# Patient Record
Sex: Female | Born: 1975 | Race: Black or African American | Hispanic: No | Marital: Married | Smoking: Never smoker
Health system: Southern US, Community
[De-identification: ages and names within clinical notes are randomized; demographics above are authoritative.]

---

## 2004-03-05 ENCOUNTER — Emergency Department (HOSPITAL_COMMUNITY): Admission: EM | Admit: 2004-03-05 | Discharge: 2004-03-06 | Payer: Self-pay | Admitting: Emergency Medicine

## 2004-04-07 ENCOUNTER — Emergency Department (HOSPITAL_COMMUNITY): Admission: EM | Admit: 2004-04-07 | Discharge: 2004-04-07 | Payer: Self-pay | Admitting: Emergency Medicine

## 2004-04-29 ENCOUNTER — Emergency Department (HOSPITAL_COMMUNITY): Admission: EM | Admit: 2004-04-29 | Discharge: 2004-04-30 | Payer: Self-pay | Admitting: Emergency Medicine

## 2004-06-15 ENCOUNTER — Emergency Department (HOSPITAL_COMMUNITY): Admission: EM | Admit: 2004-06-15 | Discharge: 2004-06-15 | Payer: Self-pay | Admitting: Emergency Medicine

## 2004-07-08 ENCOUNTER — Emergency Department (HOSPITAL_COMMUNITY): Admission: EM | Admit: 2004-07-08 | Discharge: 2004-07-08 | Payer: Self-pay | Admitting: Family Medicine

## 2004-08-17 ENCOUNTER — Emergency Department (HOSPITAL_COMMUNITY): Admission: EM | Admit: 2004-08-17 | Discharge: 2004-08-17 | Payer: Self-pay | Admitting: Internal Medicine

## 2004-10-01 ENCOUNTER — Other Ambulatory Visit: Admission: RE | Admit: 2004-10-01 | Discharge: 2004-10-01 | Payer: Self-pay | Admitting: Obstetrics and Gynecology

## 2004-11-23 ENCOUNTER — Emergency Department (HOSPITAL_COMMUNITY): Admission: EM | Admit: 2004-11-23 | Discharge: 2004-11-23 | Payer: Self-pay | Admitting: Family Medicine

## 2005-05-22 ENCOUNTER — Encounter (HOSPITAL_COMMUNITY): Admission: AD | Admit: 2005-05-22 | Discharge: 2005-06-21 | Payer: Self-pay | Admitting: Obstetrics and Gynecology

## 2005-05-23 ENCOUNTER — Emergency Department (HOSPITAL_COMMUNITY): Admission: EM | Admit: 2005-05-23 | Discharge: 2005-05-23 | Payer: Self-pay | Admitting: Emergency Medicine

## 2005-06-12 ENCOUNTER — Inpatient Hospital Stay (HOSPITAL_COMMUNITY): Admission: AD | Admit: 2005-06-12 | Discharge: 2005-06-12 | Payer: Self-pay | Admitting: Obstetrics and Gynecology

## 2005-07-14 ENCOUNTER — Encounter (HOSPITAL_COMMUNITY): Admission: AD | Admit: 2005-07-14 | Discharge: 2005-07-23 | Payer: Self-pay | Admitting: Obstetrics and Gynecology

## 2005-08-04 ENCOUNTER — Inpatient Hospital Stay (HOSPITAL_COMMUNITY): Admission: AD | Admit: 2005-08-04 | Discharge: 2005-08-04 | Payer: Self-pay | Admitting: Obstetrics and Gynecology

## 2005-08-05 ENCOUNTER — Encounter (HOSPITAL_COMMUNITY): Admission: RE | Admit: 2005-08-05 | Discharge: 2005-09-04 | Payer: Self-pay | Admitting: Obstetrics and Gynecology

## 2005-08-07 ENCOUNTER — Inpatient Hospital Stay (HOSPITAL_COMMUNITY): Admission: AD | Admit: 2005-08-07 | Discharge: 2005-08-07 | Payer: Self-pay | Admitting: Obstetrics and Gynecology

## 2005-08-09 ENCOUNTER — Inpatient Hospital Stay (HOSPITAL_COMMUNITY): Admission: AD | Admit: 2005-08-09 | Discharge: 2005-08-09 | Payer: Self-pay | Admitting: *Deleted

## 2005-08-17 ENCOUNTER — Inpatient Hospital Stay (HOSPITAL_COMMUNITY): Admission: AD | Admit: 2005-08-17 | Discharge: 2005-08-17 | Payer: Self-pay | Admitting: Obstetrics and Gynecology

## 2005-08-23 ENCOUNTER — Inpatient Hospital Stay (HOSPITAL_COMMUNITY): Admission: AD | Admit: 2005-08-23 | Discharge: 2005-08-23 | Payer: Self-pay | Admitting: Obstetrics and Gynecology

## 2005-08-29 ENCOUNTER — Inpatient Hospital Stay (HOSPITAL_COMMUNITY): Admission: AD | Admit: 2005-08-29 | Discharge: 2005-08-29 | Payer: Self-pay | Admitting: Obstetrics and Gynecology

## 2005-09-01 ENCOUNTER — Inpatient Hospital Stay (HOSPITAL_COMMUNITY): Admission: AD | Admit: 2005-09-01 | Discharge: 2005-09-01 | Payer: Self-pay | Admitting: Obstetrics and Gynecology

## 2005-09-02 ENCOUNTER — Inpatient Hospital Stay (HOSPITAL_COMMUNITY): Admission: AD | Admit: 2005-09-02 | Discharge: 2005-09-02 | Payer: Self-pay | Admitting: Obstetrics and Gynecology

## 2005-09-06 ENCOUNTER — Inpatient Hospital Stay (HOSPITAL_COMMUNITY): Admission: AD | Admit: 2005-09-06 | Discharge: 2005-09-06 | Payer: Self-pay | Admitting: Obstetrics and Gynecology

## 2005-09-07 ENCOUNTER — Inpatient Hospital Stay (HOSPITAL_COMMUNITY): Admission: AD | Admit: 2005-09-07 | Discharge: 2005-09-09 | Payer: Self-pay | Admitting: Obstetrics and Gynecology

## 2005-10-16 ENCOUNTER — Other Ambulatory Visit: Admission: RE | Admit: 2005-10-16 | Discharge: 2005-10-16 | Payer: Self-pay | Admitting: Obstetrics and Gynecology

## 2006-01-26 ENCOUNTER — Other Ambulatory Visit: Admission: RE | Admit: 2006-01-26 | Discharge: 2006-01-26 | Payer: Self-pay | Admitting: Obstetrics and Gynecology

## 2006-07-09 ENCOUNTER — Inpatient Hospital Stay (HOSPITAL_COMMUNITY): Admission: AD | Admit: 2006-07-09 | Discharge: 2006-07-09 | Payer: Self-pay | Admitting: Obstetrics and Gynecology

## 2006-07-10 ENCOUNTER — Inpatient Hospital Stay (HOSPITAL_COMMUNITY): Admission: AD | Admit: 2006-07-10 | Discharge: 2006-07-10 | Payer: Self-pay | Admitting: Obstetrics and Gynecology

## 2006-09-10 ENCOUNTER — Inpatient Hospital Stay (HOSPITAL_COMMUNITY): Admission: AD | Admit: 2006-09-10 | Discharge: 2006-09-10 | Payer: Self-pay | Admitting: Obstetrics and Gynecology

## 2006-09-12 ENCOUNTER — Observation Stay (HOSPITAL_COMMUNITY): Admission: AD | Admit: 2006-09-12 | Discharge: 2006-09-12 | Payer: Self-pay | Admitting: Obstetrics and Gynecology

## 2006-09-15 ENCOUNTER — Inpatient Hospital Stay (HOSPITAL_COMMUNITY): Admission: AD | Admit: 2006-09-15 | Discharge: 2006-09-15 | Payer: Self-pay | Admitting: Obstetrics and Gynecology

## 2006-09-19 ENCOUNTER — Inpatient Hospital Stay (HOSPITAL_COMMUNITY): Admission: AD | Admit: 2006-09-19 | Discharge: 2006-09-19 | Payer: Self-pay | Admitting: Obstetrics and Gynecology

## 2006-09-21 ENCOUNTER — Inpatient Hospital Stay (HOSPITAL_COMMUNITY): Admission: AD | Admit: 2006-09-21 | Discharge: 2006-09-23 | Payer: Self-pay | Admitting: Obstetrics and Gynecology

## 2007-01-31 ENCOUNTER — Emergency Department (HOSPITAL_COMMUNITY): Admission: EM | Admit: 2007-01-31 | Discharge: 2007-01-31 | Payer: Self-pay | Admitting: Family Medicine

## 2007-02-20 ENCOUNTER — Emergency Department (HOSPITAL_COMMUNITY): Admission: EM | Admit: 2007-02-20 | Discharge: 2007-02-20 | Payer: Self-pay | Admitting: Family Medicine

## 2011-07-22 ENCOUNTER — Emergency Department (HOSPITAL_BASED_OUTPATIENT_CLINIC_OR_DEPARTMENT_OTHER)
Admission: EM | Admit: 2011-07-22 | Discharge: 2011-07-22 | Discharge status: Against Medical Advice | Payer: Self-pay | Attending: Emergency Medicine | Admitting: Emergency Medicine

## 2011-07-22 ENCOUNTER — Encounter: Payer: Self-pay | Admitting: *Deleted

## 2011-07-22 DIAGNOSIS — S8991XA Unspecified injury of right lower leg, initial encounter: Secondary | ICD-10-CM

## 2011-07-22 DIAGNOSIS — R109 Unspecified abdominal pain: Secondary | ICD-10-CM | POA: Insufficient documentation

## 2011-07-22 NOTE — ED Notes (Signed)
Yesterday am she slipped in tub and inj her right knee.

## 2011-08-08 NOTE — ED Provider Notes (Signed)
History     CSN: [id number] Arrival date & time: 07/22/2011  1:41 PM  Chief Complaint  Patient presents with  . Leg Injury    HPI Patient was examined and had very small hematoma of her R knee.  She had no radiation of this and sustained her injury 2 days ago.  Patient had this after a fall in the tub.  She has had no prior treatment and has no other associated symptoms.  She thought she should come get this checked out.  History reviewed. No pertinent past medical history.  History reviewed. No pertinent past surgical history.  No family history on file.  History  Substance Use Topics  . Smoking status: Never Smoker   . Smokeless tobacco: Not on file  . Alcohol Use: No    OB History    Grav Para Term Preterm Abortions TAB SAB Ect Mult Living                  Review of Systems  Musculoskeletal:       R knee pain  All other systems reviewed and are negative.    Allergies  Review of patient's allergies indicates no known allergies.  Home Medications  No current outpatient prescriptions on file.  BP 108/95  Pulse 75  Temp(Src) 98.3 F (36.8 C) (Oral)  Resp 20  SpO2 100%  Physical Exam  Nursing note and vitals reviewed. Constitutional: She is oriented to person, place, and time. She appears well-developed and well-nourished. No distress.  HENT:  Head: Normocephalic and atraumatic.  Eyes: Conjunctivae and EOM are normal. Pupils are equal, round, and reactive to light.  Neck: Normal range of motion.  Musculoskeletal: Normal range of motion. She exhibits no edema.       Minimal TTP over R knee with small hematoma, no deformity or ligamentous laxity  Neurological: She is alert and oriented to person, place, and time. No cranial nerve deficit. She exhibits normal muscle tone. Coordination normal.  Skin: Skin is warm and dry. No rash noted.  Psychiatric: She has a normal mood and affect.    ED Course  Procedures (including critical care time)  Labs Reviewed -  No data to display No results found.   1. Right knee injury       MDM  Patient was evaluated and did not need any other studies she was reassured and discharged home in good condition.  She declined ibuprofen.        Cyndra Numbers, MD 08/08/11 [phone]

## 2013-09-09 ENCOUNTER — Emergency Department (HOSPITAL_BASED_OUTPATIENT_CLINIC_OR_DEPARTMENT_OTHER): Payer: Self-pay

## 2013-09-09 ENCOUNTER — Encounter (HOSPITAL_BASED_OUTPATIENT_CLINIC_OR_DEPARTMENT_OTHER): Payer: Self-pay | Admitting: Emergency Medicine

## 2013-09-09 ENCOUNTER — Emergency Department (HOSPITAL_BASED_OUTPATIENT_CLINIC_OR_DEPARTMENT_OTHER)
Admission: EM | Admit: 2013-09-09 | Discharge: 2013-09-09 | Disposition: A | Discharge status: Home / Self Care | Payer: Self-pay | Attending: Emergency Medicine | Admitting: Emergency Medicine

## 2013-09-09 DIAGNOSIS — Z3202 Encounter for pregnancy test, result negative: Secondary | ICD-10-CM | POA: Insufficient documentation

## 2013-09-09 DIAGNOSIS — R109 Unspecified abdominal pain: Secondary | ICD-10-CM

## 2013-09-09 DIAGNOSIS — N898 Other specified noninflammatory disorders of vagina: Secondary | ICD-10-CM | POA: Insufficient documentation

## 2013-09-09 DIAGNOSIS — R1031 Right lower quadrant pain: Secondary | ICD-10-CM | POA: Insufficient documentation

## 2013-09-09 DIAGNOSIS — R112 Nausea with vomiting, unspecified: Secondary | ICD-10-CM | POA: Insufficient documentation

## 2013-09-09 LAB — WET PREP, GENITAL
Clue Cells Wet Prep HPF POC: NONE SEEN
Trich, Wet Prep: NONE SEEN
Yeast Wet Prep HPF POC: NONE SEEN

## 2013-09-09 LAB — URINALYSIS, ROUTINE W REFLEX MICROSCOPIC
Bilirubin Urine: NEGATIVE
Glucose, UA: NEGATIVE mg/dL
Ketones, ur: NEGATIVE mg/dL
Leukocytes, UA: NEGATIVE
Nitrite: NEGATIVE
Protein, ur: NEGATIVE mg/dL
Specific Gravity, Urine: 1.007 (ref 1.005–1.030)
Urobilinogen, UA: 0.2 mg/dL (ref 0.0–1.0)
pH: 8 (ref 5.0–8.0)

## 2013-09-09 LAB — CBC WITH DIFFERENTIAL/PLATELET
Basophils Absolute: 0 10*3/uL (ref 0.0–0.1)
Basophils Relative: 1 % (ref 0–1)
Eosinophils Absolute: 0.1 10*3/uL (ref 0.0–0.7)
Eosinophils Relative: 2 % (ref 0–5)
HCT: 38.6 % (ref 36.0–46.0)
Hemoglobin: 12.8 g/dL (ref 12.0–15.0)
Lymphocytes Relative: 40 % (ref 12–46)
Lymphs Abs: 2 10*3/uL (ref 0.7–4.0)
MCH: 28.6 pg (ref 26.0–34.0)
MCHC: 33.2 g/dL (ref 30.0–36.0)
MCV: 86.4 fL (ref 78.0–100.0)
Monocytes Absolute: 0.6 10*3/uL (ref 0.1–1.0)
Monocytes Relative: 11 % (ref 3–12)
Neutro Abs: 2.3 10*3/uL (ref 1.7–7.7)
Neutrophils Relative %: 46 % (ref 43–77)
Platelets: 322 10*3/uL (ref 150–400)
RBC: 4.47 MIL/uL (ref 3.87–5.11)
RDW: 13.8 % (ref 11.5–15.5)
WBC: 5 10*3/uL (ref 4.0–10.5)

## 2013-09-09 LAB — LIPASE, BLOOD: Lipase: 16 U/L (ref 11–59)

## 2013-09-09 LAB — COMPREHENSIVE METABOLIC PANEL
ALT: 9 U/L (ref 0–35)
AST: 19 U/L (ref 0–37)
Albumin: 3.6 g/dL (ref 3.5–5.2)
Alkaline Phosphatase: 64 U/L (ref 39–117)
BUN: 8 mg/dL (ref 6–23)
CO2: 27 mEq/L (ref 19–32)
Calcium: 9.4 mg/dL (ref 8.4–10.5)
Chloride: 104 mEq/L (ref 96–112)
Creatinine, Ser: 0.7 mg/dL (ref 0.50–1.10)
GFR calc Af Amer: >90 mL/min (ref >90)
GFR calc non Af Amer: >90 mL/min (ref >90)
Glucose, Bld: 87 mg/dL (ref 70–99)
Potassium: 4 mEq/L (ref 3.5–5.1)
Sodium: 139 mEq/L (ref 135–145)
Total Bilirubin: 0.5 mg/dL (ref 0.3–1.2)
Total Protein: 7.9 g/dL (ref 6.0–8.3)

## 2013-09-09 LAB — PREGNANCY, URINE: Preg Test, Ur: NEGATIVE

## 2013-09-09 LAB — URINE MICROSCOPIC-ADD ON: WBC, UA: NONE SEEN WBC/hpf (ref <3)

## 2013-09-09 MED ORDER — IOHEXOL 300 MG/ML  SOLN
100.0000 mL | Freq: Once | INTRAMUSCULAR | Status: AC | PRN
  Administered 2013-09-09: 100 mL via INTRAVENOUS

## 2013-09-09 MED ORDER — HYDROCODONE-ACETAMINOPHEN 5-325 MG PO TABS: 1.0000 | ORAL_TABLET | ORAL | Status: AC | PRN

## 2013-09-09 MED ORDER — IOHEXOL 300 MG/ML  SOLN
50.0000 mL | Freq: Once | INTRAMUSCULAR | Status: AC | PRN
  Administered 2013-09-09: 50 mL via ORAL

## 2013-09-09 MED ORDER — IBUPROFEN 400 MG PO TABS: 400.0000 mg | ORAL_TABLET | Freq: Four times a day (QID) | ORAL | Status: AC | PRN

## 2013-09-09 NOTE — ED Notes (Signed)
Pt c/o RLQ pain x 3 wks and radiating down right leg today. Pain worse past 2 days, denies n/v/d. Last bowel movement "normal" per pt this morning. Pt is menstruating at present. Denies h/o same. Denies fever.

## 2013-09-09 NOTE — ED Notes (Signed)
Patient refused to have IV inserted.
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
## 2013-09-09 NOTE — ED Notes (Signed)
Patient transported to CT 

## 2013-09-10 LAB — GC/CHLAMYDIA PROBE AMP
CT Probe RNA: NEGATIVE
GC Probe RNA: NEGATIVE

## 2019-04-03 ENCOUNTER — Emergency Department (HOSPITAL_BASED_OUTPATIENT_CLINIC_OR_DEPARTMENT_OTHER)
Admission: EM | Admit: 2019-04-03 | Discharge: 2019-04-03 | Disposition: A | Discharge status: Home / Self Care | Payer: BLUE CROSS/BLUE SHIELD | Attending: Emergency Medicine | Admitting: Emergency Medicine

## 2019-04-03 ENCOUNTER — Encounter (HOSPITAL_BASED_OUTPATIENT_CLINIC_OR_DEPARTMENT_OTHER): Payer: Self-pay

## 2019-04-03 ENCOUNTER — Other Ambulatory Visit: Payer: Self-pay

## 2019-04-03 DIAGNOSIS — M62838 Other muscle spasm: Secondary | ICD-10-CM | POA: Diagnosis not present

## 2019-04-03 DIAGNOSIS — M542 Cervicalgia: Secondary | ICD-10-CM | POA: Insufficient documentation

## 2019-04-03 DIAGNOSIS — Y9389 Activity, other specified: Secondary | ICD-10-CM | POA: Insufficient documentation

## 2019-04-03 DIAGNOSIS — Y998 Other external cause status: Secondary | ICD-10-CM | POA: Diagnosis not present

## 2019-04-03 DIAGNOSIS — M25512 Pain in left shoulder: Secondary | ICD-10-CM | POA: Insufficient documentation

## 2019-04-03 DIAGNOSIS — M25511 Pain in right shoulder: Secondary | ICD-10-CM | POA: Diagnosis present

## 2019-04-03 DIAGNOSIS — Y92410 Unspecified street and highway as the place of occurrence of the external cause: Secondary | ICD-10-CM | POA: Insufficient documentation

## 2019-04-03 MED ORDER — CYCLOBENZAPRINE HCL 10 MG PO TABS: 10.0000 mg | ORAL_TABLET | Freq: Two times a day (BID) | ORAL | 0 refills | Status: AC | PRN

## 2019-04-03 NOTE — Discharge Instructions (Addendum)
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
## 2019-12-24 ENCOUNTER — Emergency Department (HOSPITAL_BASED_OUTPATIENT_CLINIC_OR_DEPARTMENT_OTHER): Payer: BC Managed Care – PPO

## 2019-12-24 ENCOUNTER — Emergency Department (HOSPITAL_BASED_OUTPATIENT_CLINIC_OR_DEPARTMENT_OTHER)
Admission: EM | Admit: 2019-12-24 | Discharge: 2019-12-24 | Disposition: A | Discharge status: Home / Self Care | Payer: BC Managed Care – PPO | Attending: Emergency Medicine | Admitting: Emergency Medicine

## 2019-12-24 ENCOUNTER — Other Ambulatory Visit: Payer: Self-pay

## 2019-12-24 ENCOUNTER — Encounter (HOSPITAL_BASED_OUTPATIENT_CLINIC_OR_DEPARTMENT_OTHER): Payer: Self-pay | Admitting: *Deleted

## 2019-12-24 DIAGNOSIS — R112 Nausea with vomiting, unspecified: Secondary | ICD-10-CM | POA: Insufficient documentation

## 2019-12-24 DIAGNOSIS — R1084 Generalized abdominal pain: Secondary | ICD-10-CM

## 2019-12-24 DIAGNOSIS — R1111 Vomiting without nausea: Secondary | ICD-10-CM

## 2019-12-24 DIAGNOSIS — N3000 Acute cystitis without hematuria: Secondary | ICD-10-CM | POA: Diagnosis not present

## 2019-12-24 LAB — CBC WITH DIFFERENTIAL/PLATELET
Abs Immature Granulocytes: 0.01 10*3/uL (ref 0.00–0.07)
Basophils Absolute: 0 10*3/uL (ref 0.0–0.1)
Basophils Relative: 0 %
Eosinophils Absolute: 0.1 10*3/uL (ref 0.0–0.5)
Eosinophils Relative: 2 %
HCT: 43.3 % (ref 36.0–46.0)
Hemoglobin: 13.9 g/dL (ref 12.0–15.0)
Immature Granulocytes: 0 %
Lymphocytes Relative: 39 %
Lymphs Abs: 1.9 10*3/uL (ref 0.7–4.0)
MCH: 29.4 pg (ref 26.0–34.0)
MCHC: 32.1 g/dL (ref 30.0–36.0)
MCV: 91.5 fL (ref 80.0–100.0)
Monocytes Absolute: 0.5 10*3/uL (ref 0.1–1.0)
Monocytes Relative: 11 %
Neutro Abs: 2.2 10*3/uL (ref 1.7–7.7)
Neutrophils Relative %: 48 %
Platelets: 404 10*3/uL — ABNORMAL HIGH (ref 150–400)
RBC: 4.73 MIL/uL (ref 3.87–5.11)
RDW: 13.8 % (ref 11.5–15.5)
WBC: 4.8 10*3/uL (ref 4.0–10.5)
nRBC: 0 % (ref 0.0–0.2)

## 2019-12-24 LAB — LIPASE, BLOOD: Lipase: 22 U/L (ref 11–51)

## 2019-12-24 LAB — COMPREHENSIVE METABOLIC PANEL
ALT: 24 U/L (ref 0–44)
AST: 35 U/L (ref 15–41)
Albumin: 4.2 g/dL (ref 3.5–5.0)
Alkaline Phosphatase: 65 U/L (ref 38–126)
Anion gap: 7 (ref 5–15)
BUN: 12 mg/dL (ref 6–20)
CO2: 31 mmol/L (ref 22–32)
Calcium: 9.2 mg/dL (ref 8.9–10.3)
Chloride: 100 mmol/L (ref 98–111)
Creatinine, Ser: 0.64 mg/dL (ref 0.44–1.00)
GFR calc Af Amer: >60 mL/min (ref >60)
GFR calc non Af Amer: >60 mL/min (ref >60)
Glucose, Bld: 69 mg/dL — ABNORMAL LOW (ref 70–99)
Potassium: 3.2 mmol/L — ABNORMAL LOW (ref 3.5–5.1)
Sodium: 138 mmol/L (ref 135–145)
Total Bilirubin: 0.4 mg/dL (ref 0.3–1.2)
Total Protein: 8.9 g/dL — ABNORMAL HIGH (ref 6.5–8.1)

## 2019-12-24 LAB — URINALYSIS, ROUTINE W REFLEX MICROSCOPIC
Bilirubin Urine: NEGATIVE
Glucose, UA: NEGATIVE mg/dL
Ketones, ur: NEGATIVE mg/dL
Leukocytes,Ua: NEGATIVE
Nitrite: NEGATIVE
Protein, ur: NEGATIVE mg/dL
Specific Gravity, Urine: 1.02 (ref 1.005–1.030)
pH: 8.5 — ABNORMAL HIGH (ref 5.0–8.0)

## 2019-12-24 LAB — URINALYSIS, MICROSCOPIC (REFLEX)

## 2019-12-24 LAB — PREGNANCY, URINE: Preg Test, Ur: NEGATIVE

## 2019-12-24 MED ORDER — SODIUM CHLORIDE 0.9 % IV BOLUS
1000.0000 mL | Freq: Once | INTRAVENOUS | Status: AC
  Administered 2019-12-24: 19:00:00 1000 mL via INTRAVENOUS

## 2019-12-24 MED ORDER — CEPHALEXIN 500 MG PO CAPS: 500.0000 mg | ORAL_CAPSULE | Freq: Two times a day (BID) | ORAL | 0 refills | Status: AC

## 2019-12-24 MED ORDER — IOHEXOL 300 MG/ML  SOLN
100.0000 mL | Freq: Once | INTRAMUSCULAR | Status: AC | PRN
  Administered 2019-12-24: 19:00:00 100 mL via INTRAVENOUS

## 2019-12-24 MED ORDER — POLYETHYLENE GLYCOL 3350 17 G PO PACK: 17.0000 g | PACK | Freq: Every day | ORAL | 0 refills | Status: AC

## 2019-12-24 MED ORDER — ONDANSETRON HCL 4 MG/2ML IJ SOLN
4.0000 mg | Freq: Once | INTRAMUSCULAR | Status: AC
  Administered 2019-12-24: 18:00:00 4 mg via INTRAVENOUS
  Filled 2019-12-24: qty 2

## 2019-12-24 MED ORDER — ONDANSETRON HCL 4 MG PO TABS: 4.0000 mg | ORAL_TABLET | Freq: Four times a day (QID) | ORAL | 0 refills | Status: AC

## 2019-12-24 NOTE — ED Notes (Addendum)
Carol from lab to add on urine culture to urine in lab.
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
## 2019-12-24 NOTE — ED Provider Notes (Signed)
[facility] Provider Note   CSN: [id number] Arrival date & time: 12/24/19  1756     History Chief Complaint  Patient presents with  . Emesis    Jessica Garrison is a 44 y.o. female.   The history is provided by the patient.  Abdominal Pain Pain location:  Generalized Pain quality: aching and cramping   Pain radiates to:  Does not radiate Pain severity:  Moderate Onset quality:  Gradual Duration:  2 weeks Timing:  Intermittent Progression:  Waxing and waning Chronicity:  Recurrent Context: not previous surgeries and not sick contacts   Relieved by:  Nothing Worsened by:  Nothing Associated symptoms: nausea and vomiting   Associated symptoms: no anorexia, no chest pain, no chills, no cough, no diarrhea, no dysuria, no fatigue, no fever, no hematuria, no shortness of breath, no sore throat, no vaginal bleeding and no vaginal discharge   Risk factors: has not had multiple surgeries and not pregnant        History reviewed. No pertinent past medical history.  There are no problems to display for this patient.   History reviewed. No pertinent surgical history.   OB History   No obstetric history on file.     History reviewed. No pertinent family history.  Social History   Tobacco Use  . Smoking status: Never Smoker  Substance Use Topics  . Alcohol use: No  . Drug use: No    Home Medications Prior to Admission medications   Medication Sig Start Date End Date Taking? Authorizing Provider  cephALEXin (KEFLEX) 500 MG capsule Take 1 capsule (500 mg total) by mouth 2 (two) times daily for 5 days. 12/24/19 12/29/19  Kenyatta Keidel, DO  cyclobenzaprine (FLEXERIL) 10 MG tablet Take 1 tablet (10 mg total) by mouth 2 (two) times daily as needed for muscle spasms. 04/03/19   Robinson, [place] N, PA-C  HYDROcodone-acetaminophen (NORCO/VICODIN) 5-325 MG per tablet Take 1 tablet by mouth every 4 (four) hours as needed. 09/09/13   Serita Grit, MD    ibuprofen (ADVIL,MOTRIN) 400 MG tablet Take 1 tablet (400 mg total) by mouth every 6 (six) hours as needed. 09/09/13   Serita Grit, MD  ondansetron (ZOFRAN) 4 MG tablet Take 1 tablet (4 mg total) by mouth every 6 (six) hours for 20 doses. 12/24/19 12/29/19  Devoiry Corriher, DO  polyethylene glycol (MIRALAX / GLYCOLAX) 17 g packet Take 17 g by mouth daily for 14 days. 12/24/19 01/07/20  Lennice Sites, DO    Allergies    Patient has no known allergies.  Review of Systems   Review of Systems  Constitutional: Negative for chills, fatigue and fever.  HENT: Negative for ear pain and sore throat.   Eyes: Negative for pain and visual disturbance.  Respiratory: Negative for cough and shortness of breath.   Cardiovascular: Negative for chest pain and palpitations.  Gastrointestinal: Positive for abdominal pain, nausea and vomiting. Negative for anorexia and diarrhea.  Genitourinary: Negative for dysuria, hematuria, vaginal bleeding and vaginal discharge.  Musculoskeletal: Negative for arthralgias and back pain.  Skin: Negative for color change and rash.  Neurological: Negative for seizures and syncope.  All other systems reviewed and are negative.   Physical Exam Updated Vital Signs  ED Triage Vitals [12/24/19 1803]  Enc Vitals Group     BP 139/77     Pulse Rate 73     Resp 18     Temp 97.6 F (36.4 C)     Temp Source Oral  SpO2 100 %     Weight 174 lb (78.9 kg)     Height 5\' 8"  (1.727 m)     Head Circumference      Peak Flow      Pain Score 8     Pain Loc      Pain Edu?      Excl. in GC?     Physical Exam Vitals and nursing note reviewed.  Constitutional:      General: She is not in acute distress.    Appearance: She is well-developed. She is not ill-appearing.  HENT:     Head: Normocephalic and atraumatic.     Nose: Nose normal.     Mouth/Throat:     Mouth: Mucous membranes are moist.  Eyes:     Extraocular Movements: Extraocular movements intact.      Conjunctiva/sclera: Conjunctivae normal.     Pupils: Pupils are equal, round, and reactive to light.  Cardiovascular:     Rate and Rhythm: Normal rate and regular rhythm.     Pulses: Normal pulses.     Heart sounds: Normal heart sounds. No murmur.  Pulmonary:     Effort: Pulmonary effort is normal. No respiratory distress.     Breath sounds: Normal breath sounds.  Abdominal:     General: Abdomen is flat. There is distension.     Palpations: Abdomen is soft. There is no mass.     Tenderness: There is abdominal tenderness (diffusely). There is no guarding.     Hernia: No hernia is present.  Musculoskeletal:        General: No swelling or tenderness. Normal range of motion.     Cervical back: Neck supple.  Skin:    General: Skin is warm and dry.     Capillary Refill: Capillary refill takes less than 2 seconds.  Neurological:     General: No focal deficit present.     Mental Status: She is alert.  Psychiatric:        Mood and Affect: Mood normal.     ED Results / Procedures / Treatments   Labs (all labs ordered are listed, but only abnormal results are displayed) Labs Reviewed  URINALYSIS, ROUTINE W REFLEX MICROSCOPIC - Abnormal; Notable for the following components:      Result Value   pH 8.5 (*)    Hgb urine dipstick SMALL (*)    All other components within normal limits  CBC WITH DIFFERENTIAL/PLATELET - Abnormal; Notable for the following components:   Platelets 404 (*)    All other components within normal limits  COMPREHENSIVE METABOLIC PANEL - Abnormal; Notable for the following components:   Potassium 3.2 (*)    Glucose, Bld 69 (*)    Total Protein 8.9 (*)    All other components within normal limits  URINALYSIS, MICROSCOPIC (REFLEX) - Abnormal; Notable for the following components:   Bacteria, UA MANY (*)    All other components within normal limits  URINE CULTURE  PREGNANCY, URINE  LIPASE, BLOOD    EKG None  Radiology CT ABDOMEN PELVIS W  CONTRAST  Result Date: 12/24/2019 CLINICAL DATA:  Nausea and vomiting. Epigastric pain for 2-3 weeks. Abdominal distension. EXAM: CT ABDOMEN AND PELVIS WITH CONTRAST TECHNIQUE: Multidetector CT imaging of the abdomen and pelvis was performed using the standard protocol following bolus administration of intravenous contrast. CONTRAST:  12/26/2019 OMNIPAQUE IOHEXOL 300 MG/ML  SOLN COMPARISON:  Abdominal CT 09/09/2013. Report from abdominal CT 09/06/2014, images not available. FINDINGS: Lower chest: No  acute findings. Hepatobiliary: No focal liver abnormality is seen. No gallstones, gallbladder wall thickening, or biliary dilatation. Pancreas: No ductal dilatation or inflammation. Spleen: Normal in size without focal abnormality. Adrenals/Urinary Tract: Normal adrenal glands. No hydronephrosis or perinephric edema. Chronic fullness of the right renal collecting system homogeneous renal enhancement with symmetric excretion on delayed phase imaging. Urinary bladder is physiologically distended, equivocal wall thickening about the bladder dome. Stomach/Bowel: The stomach is distended with ingested contents. Increased density in the pre pyloric region is likely layering gastric contents, limited assessment for gastric wall thickening in this region. There is no other gastric wall thickening. Duodenum is normally distended. Normal positioning of the ligament of Treitz. No small bowel obstruction or inflammation. No abnormal small bowel distention. Normal appendix. Moderate colonic stool burden. No colonic wall thickening or inflammation. Sigmoid colon is mildly redundant. Vascular/Lymphatic: Normal caliber abdominal aorta. No acute vascular findings. The portal vein is patent. Mesenteric vessels are patent. No adenopathy. Reproductive: Retroverted uterus. No adnexal mass. Other: No free air, free fluid, or intra-abdominal fluid collection. Musculoskeletal: There are no acute or suspicious osseous abnormalities. IMPRESSION: 1.  Gastric distention with increased density in the pre pyloric region, likely layering gastric contents. Limited assessment for gastric wall thickening in this region. This may be related to recent p.o. ingestion versus gastroparesis. No evidence of gastric outlet obstruction. 2. Equivocal bladder dome wall thickening, recommend correlation with urinalysis to exclude urinary tract infection. 3. Moderate colonic stool burden, can be seen with constipation. Electronically Signed   By: Narda Rutherford M.D.   On: 12/24/2019 19:35    Procedures Procedures (including critical care time)  Medications Ordered in ED Medications  ondansetron (ZOFRAN) injection 4 mg (4 mg Intravenous Given 12/24/19 1822)  sodium chloride 0.9 % bolus 1,000 mL (1,000 mLs Intravenous New Bag/Given 12/24/19 1845)  iohexol (OMNIPAQUE) 300 MG/ML solution 100 mL (100 mLs Intravenous Contrast Given 12/24/19 1916)    ED Course  I have reviewed the triage vital signs and the nursing notes.  Pertinent labs & imaging results that were available during my care of the patient were reviewed by me and considered in my medical decision making (see chart for details).    MDM Rules/Calculators/A&P                      Jessica Garrison is a 44 year old female with no significant medical history presents to the ED with abdominal pain.  Normal vitals.  No fever.  Pain ongoing for the last 2 weeks but worse over the last several days.  Pain is associated with eating.  Pain shortly after eating.  However pain is diffuse on exam.  No specific Murphy sign.  No history abdominal surgeries.  Has had maybe some constipation.  Has had nausea and vomiting.  Concern for gastroparesis versus bowel obstruction versus cholecystitis or other gallbladder and liver etiology.  Possibly gastritis.  Will give Zofran, IV fluids.  Will check general labs and urinalysis.  Urinalysis possibly infected.  Does have some urinary symptoms will treat with Keflex.  Otherwise no  significant anemia, electrolyte abnormality, kidney injury, leukocytosis.  CT scan possibly suggestive of gastroparesis.  However no bowel obstruction.  There is also moderate stool burden.  So overall patient feeling better after IV fluids and Zofran.  Likely multifactorial symptoms today.  Likely some aspect of gastroparesis given the chronic nature of her symptoms.  However could be complicated with constipation and UTI.  Will prescribe Keflex, Zofran, MiraLAX.  We will have her follow-up with her primary care physician and give her referral to GI as after further talking with the patient she has had this type of problem for over a year.  Given return precautions and discharged in the ED in good condition.  This chart was dictated using voice recognition software.  Despite best efforts to proofread,  errors can occur which can change the documentation meaning.    Final Clinical Impression(s) / ED Diagnoses Final diagnoses:  Generalized abdominal pain  Acute cystitis without hematuria  Vomiting without nausea, intractability of vomiting not specified, unspecified vomiting type    Rx / DC Orders ED Discharge Orders         Ordered    ondansetron (ZOFRAN) 4 MG tablet  Every 6 hours     12/24/19 2001    cephALEXin (KEFLEX) 500 MG capsule  2 times daily     12/24/19 2001    polyethylene glycol (MIRALAX / GLYCOLAX) 17 g packet  Daily     12/24/19 2001           [place], DO 12/24/19 2002

## 2019-12-26 LAB — URINE CULTURE: Culture: NO GROWTH

## 2021-08-25 IMAGING — CT CT ABD-PELV W/ CM
2 of 5 series · 16 of 46 positions shown, 18 images · IV contrast (Omnipaque)
Comparison: Abdominal CT 09/09/2013. Report from abdominal CT
09/06/2014, images not available.

CLINICAL DATA: Nausea and vomiting. Epigastric pain for 2-3 weeks.
Abdominal distension.

EXAM:
CT ABDOMEN AND PELVIS WITH CONTRAST
TECHNIQUE: Multidetector CT imaging of the abdomen and pelvis was performed
using the standard protocol following bolus administration of
intravenous contrast.
CONTRAST:  100mL OMNIPAQUE IOHEXOL 300 MG/ML  SOLN

[Series 2: axial st · axial · 0.97mm/px · z∈[+419,+839]mm · 13 of 96 slices shown, 15 images]
[im 6/96  soft-tissue]
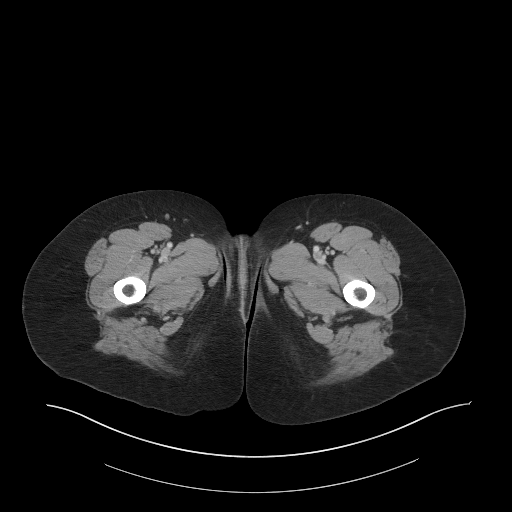
[im 6/96  bone]
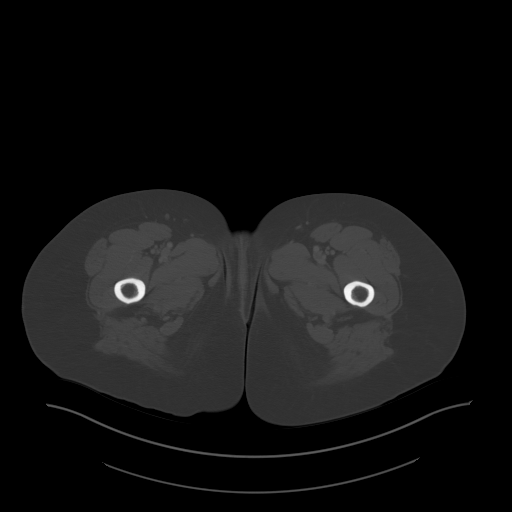
[im 11/96  soft-tissue]
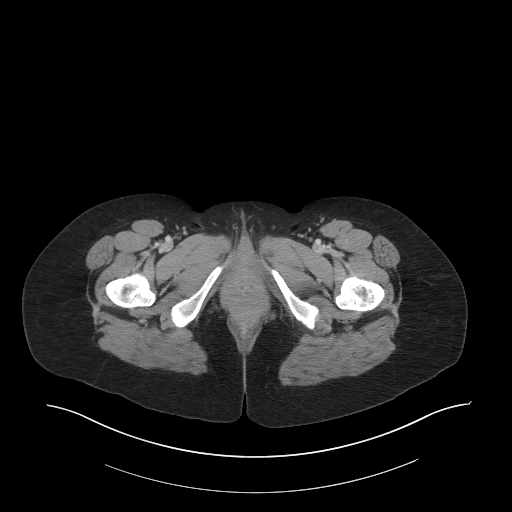
[im 22/96  soft-tissue]
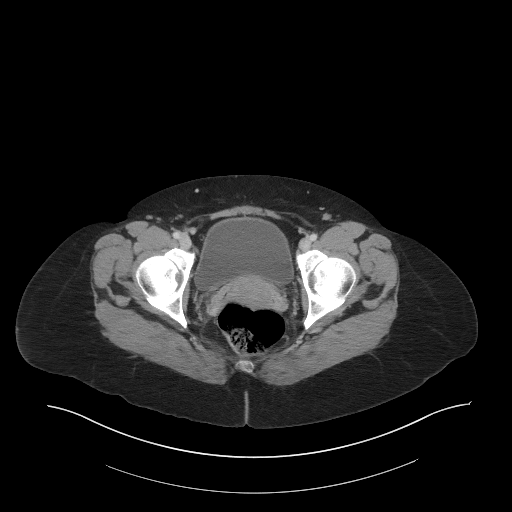
[im 27/96  soft-tissue]
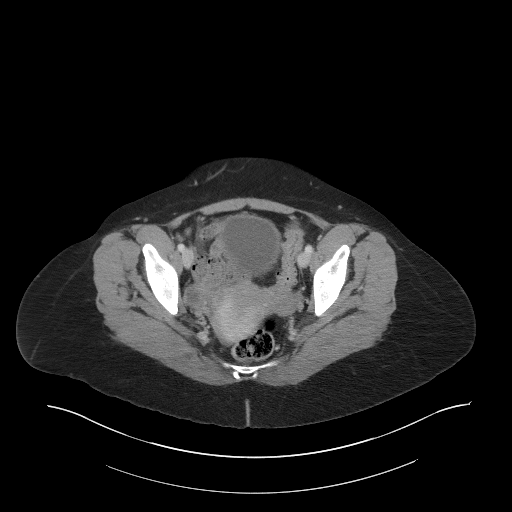
[im 32/96  soft-tissue]
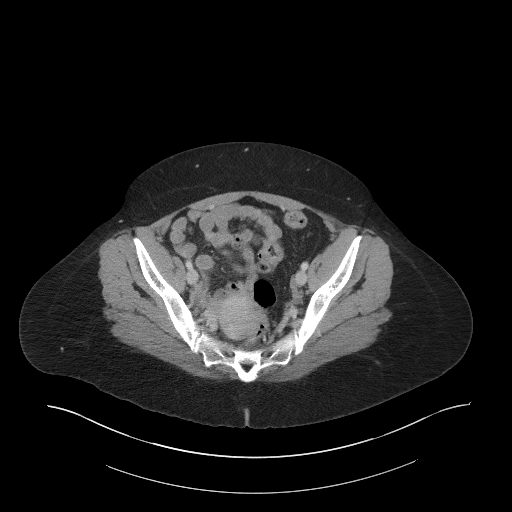
[im 43/96  soft-tissue]
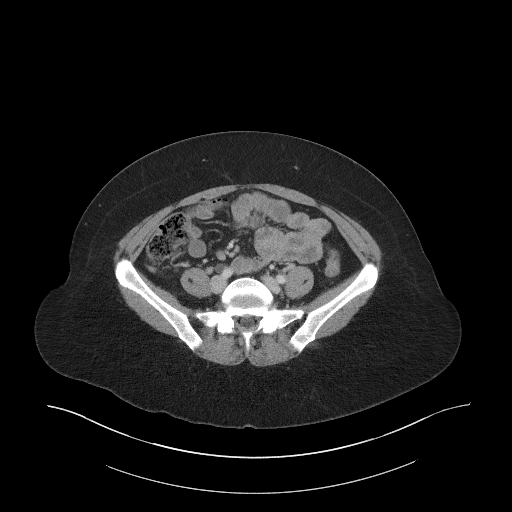
[im 48/96  soft-tissue]
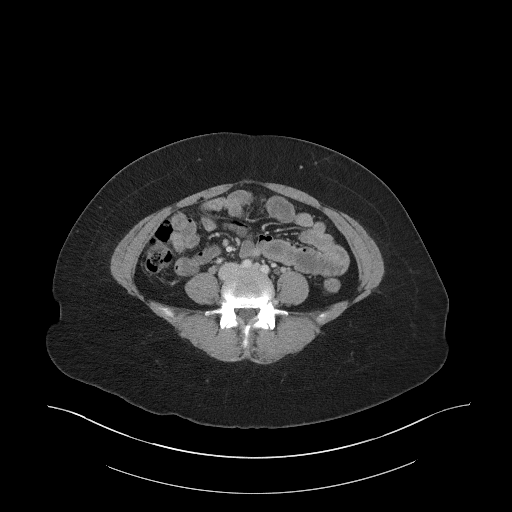
[im 53/96  soft-tissue]
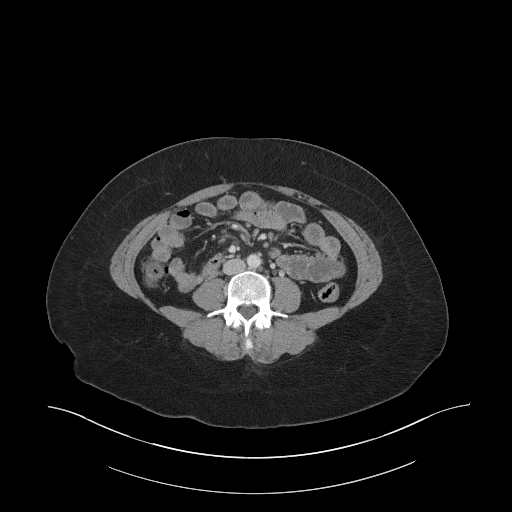
[im 64/96  soft-tissue]
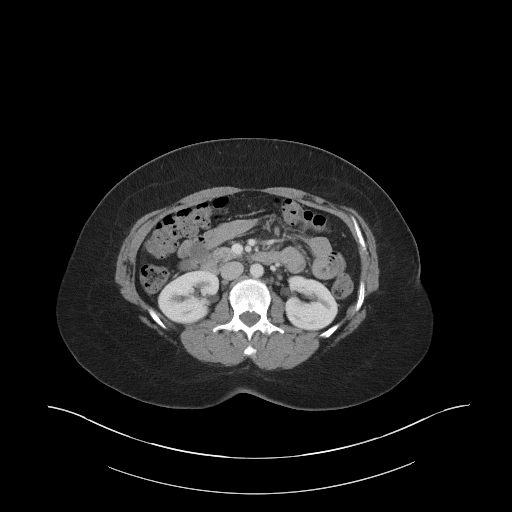
[im 64/96  bone]
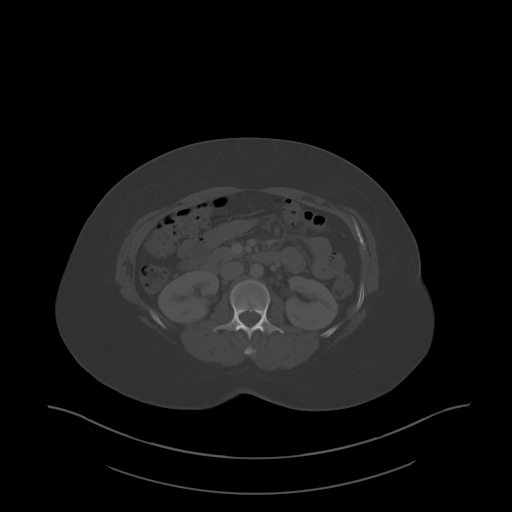
[im 69/96  soft-tissue]
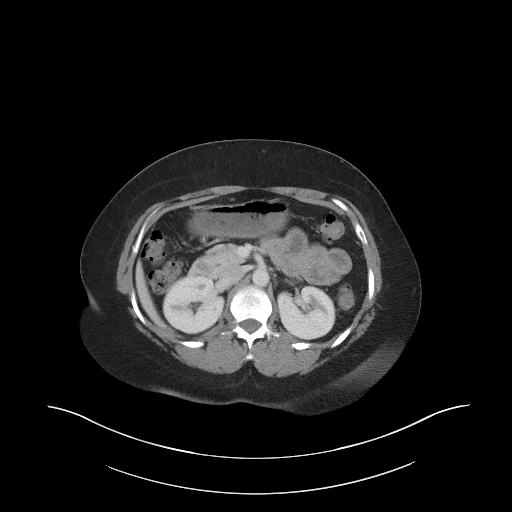
[im 74/96  soft-tissue]
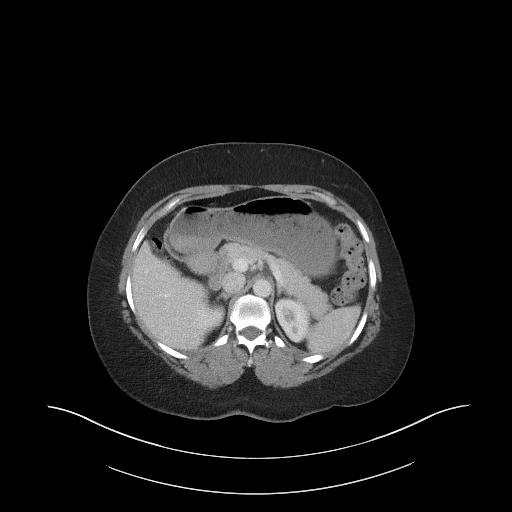
[im 85/96  soft-tissue]
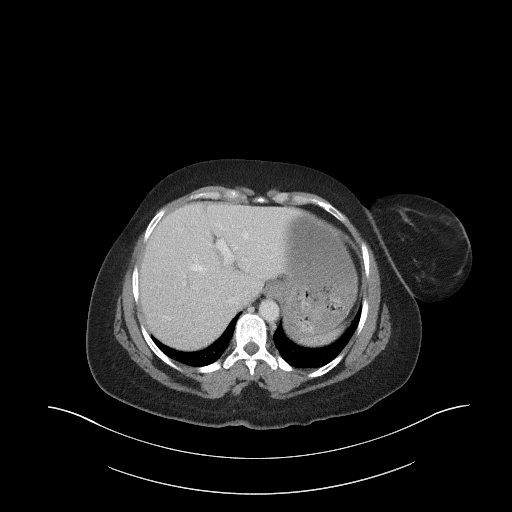
[im 90/96  soft-tissue]
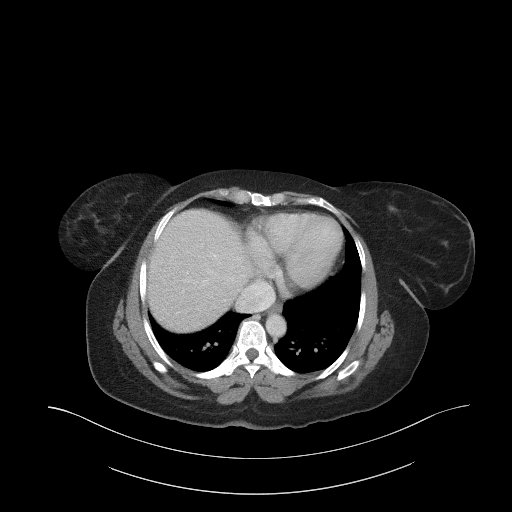

[Series 5: coronal st · coronal · 0.88mm/px · 3 of 94 slices shown]
[im 32/94  soft-tissue]
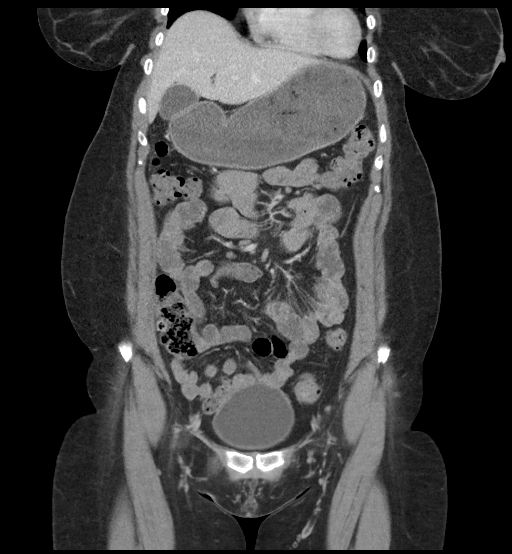
[im 42/94  soft-tissue]
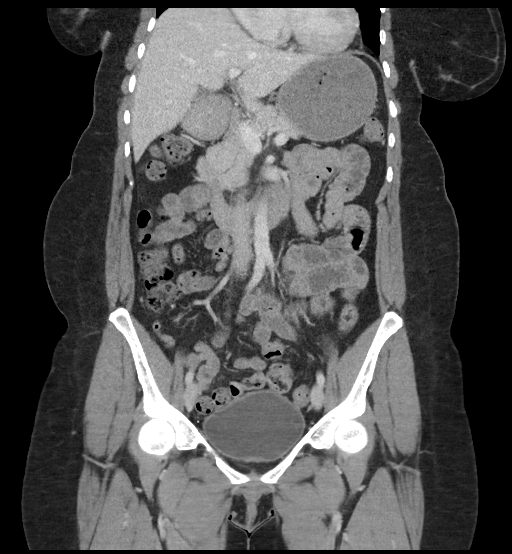
[im 52/94  soft-tissue]
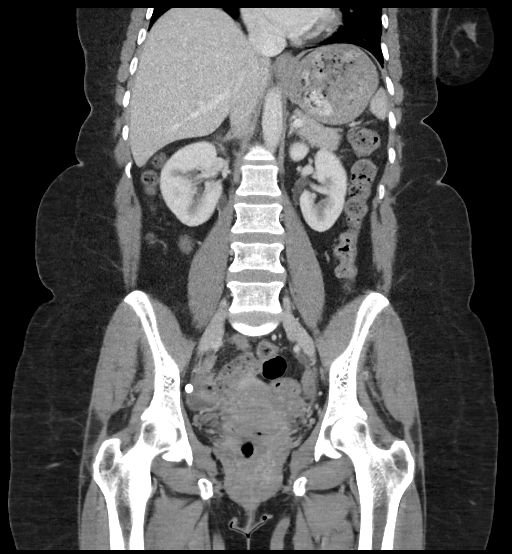

[16 of 46 positions shown; findings below may reference images not displayed]

FINDINGS: Lower chest: No acute findings.

Hepatobiliary: No focal liver abnormality is seen. No gallstones,
gallbladder wall thickening, or biliary dilatation.

Pancreas: No ductal dilatation or inflammation.

Spleen: Normal in size without focal abnormality.

Adrenals/Urinary Tract: Normal adrenal glands. No hydronephrosis or
perinephric edema. Chronic fullness of the right renal collecting
system homogeneous renal enhancement with symmetric excretion on
delayed phase imaging. Urinary bladder is physiologically distended,
equivocal wall thickening about the bladder dome.

Stomach/Bowel: The stomach is distended with ingested contents.
Increased density in the pre pyloric region is likely layering
gastric contents, limited assessment for gastric wall thickening in
this region. There is no other gastric wall thickening. Duodenum is
normally distended. Normal positioning of the ligament of Treitz. No
small bowel obstruction or inflammation. No abnormal small bowel
distention. Normal appendix. Moderate colonic stool burden. No
colonic wall thickening or inflammation. Sigmoid colon is mildly
redundant.

Vascular/Lymphatic: Normal caliber abdominal aorta. No acute
vascular findings. The portal vein is patent. Mesenteric vessels are
patent. No adenopathy.

Reproductive: Retroverted uterus. No adnexal mass.

Other: No free air, free fluid, or intra-abdominal fluid collection.

Musculoskeletal: There are no acute or suspicious osseous
abnormalities.
IMPRESSION: 1. Gastric distention with increased density in the pre pyloric
region, likely layering gastric contents. Limited assessment for
gastric wall thickening in this region. This may be related to
recent p.o. ingestion versus gastroparesis. No evidence of gastric
outlet obstruction.
2. Equivocal bladder dome wall thickening, recommend correlation
with urinalysis to exclude urinary tract infection.
3. Moderate colonic stool burden, can be seen with constipation.

## 2024-01-17 ENCOUNTER — Encounter (HOSPITAL_BASED_OUTPATIENT_CLINIC_OR_DEPARTMENT_OTHER): Payer: Self-pay | Admitting: Emergency Medicine

## 2024-01-17 ENCOUNTER — Emergency Department (HOSPITAL_BASED_OUTPATIENT_CLINIC_OR_DEPARTMENT_OTHER)
Admission: EM | Admit: 2024-01-17 | Discharge: 2024-01-17 | Disposition: A | Discharge status: Home / Self Care | Attending: Emergency Medicine | Admitting: Emergency Medicine

## 2024-01-17 ENCOUNTER — Other Ambulatory Visit: Payer: Self-pay

## 2024-01-17 DIAGNOSIS — R29898 Other symptoms and signs involving the musculoskeletal system: Secondary | ICD-10-CM

## 2024-01-17 DIAGNOSIS — R531 Weakness: Secondary | ICD-10-CM | POA: Diagnosis present

## 2024-01-17 MED ORDER — ACETAMINOPHEN 500 MG PO TABS
1000.0000 mg | ORAL_TABLET | Freq: Once | ORAL | Status: AC
  Administered 2024-01-17: 1000 mg via ORAL
  Filled 2024-01-17: qty 2

## 2024-01-17 MED ORDER — CELECOXIB 200 MG PO CAPS: 200.0000 mg | ORAL_CAPSULE | Freq: Two times a day (BID) | ORAL | 0 refills | Status: AC

## 2024-01-17 NOTE — ED Triage Notes (Signed)
 Right lower arm pain and weakness localized to area from right elbow to right hand since Friday. States she works at a [employer] and feels like she "overworked" her arm. Upon assessment, decreased sensation and movement in right arm. Right radial pulse intact.

## 2024-01-17 NOTE — ED Provider Notes (Signed)
 [facility] [facility] AT [facility] Provider Note   CSN: [id number] Arrival date & time: 01/17/24  1810     History Chief Complaint  Patient presents with   Weakness    RIGHT ARM    HPI Jessica Garrison is a 48 y.o. female presenting for right hand weakness.  States has been present for approximately 72 hours and that it is recurrent in nature.  States that she works at the [employer] and noticed that she was having difficulty holding the knife on Friday at work.  Patient's recorded medical, surgical, social, medication list and allergies were reviewed in the Snapshot window as part of the initial history.   Review of Systems   Review of Systems  Constitutional:  Negative for chills and fever.  HENT:  Negative for ear pain and sore throat.   Eyes:  Negative for pain and visual disturbance.  Respiratory:  Negative for cough and shortness of breath.   Cardiovascular:  Negative for chest pain and palpitations.  Gastrointestinal:  Negative for abdominal pain and vomiting.  Genitourinary:  Negative for dysuria and hematuria.  Musculoskeletal:  Negative for arthralgias and back pain.  Skin:  Negative for color change and rash.  Neurological:  Positive for weakness. Negative for seizures and syncope.  All other systems reviewed and are negative.   Physical Exam Updated Vital Signs BP 124/82 (BP Location: Left Arm)   Pulse 79   Temp 98.1 F (36.7 C)   Resp 18   Ht 5\' 8"  (1.727 m)   Wt 64 kg   SpO2 100%   BMI 21.44 kg/m  Physical Exam Constitutional:      General: She is not in acute distress.    Appearance: She is not ill-appearing or toxic-appearing.  HENT:     Head: Normocephalic and atraumatic.  Eyes:     Extraocular Movements: Extraocular movements intact.     Pupils: Pupils are equal, round, and reactive to light.  Cardiovascular:     Rate and Rhythm: Normal rate.  Pulmonary:     Effort: No respiratory distress.   Abdominal:     General: Abdomen is flat.  Musculoskeletal:        General: No swelling, deformity or signs of injury.     Cervical back: Normal range of motion. No rigidity.  Skin:    General: Skin is warm and dry.  Neurological:     General: No focal deficit present.     Mental Status: She is alert and oriented to person, place, and time.     Motor: Weakness (4/5 grip strength and brachiaradialis strength) present.  Psychiatric:        Mood and Affect: Mood normal.      ED Course/ Medical Decision Making/ A&P    Procedures Procedures   Medications Ordered in ED Medications  acetaminophen (TYLENOL) tablet 1,000 mg (has no administration in time range)    Medical Decision Making:   This 48 year old female presented with a chief complaint of right-sided grip strength weakness. Denies fevers chills nausea vomiting shortness of breath.  No numbness. The right hand has substantial grip strength weakness and she is unable to hold a bottle of water. 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 States that she has had this before secondary to overuse  injury at her right elbow. She has grip strength weakness mostly localized around wrist tenderness. 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 She states that she has improved with bracing and supportive care in the past. Will prescribe her NSAID, brace her right wrist and refer to orthopedics for further care and management the outpatient setting. Notably, I considered stroke, however given the localization being just to the right forearm, patient's description of worsening with use, gradual onset, improving with rest stroke is considered less likely.  Peripheral neuropathy much more  consistent.  Cervical etiology considered less likely due to lack of any focal neck pain or symptoms at the shoulder or above.  Disposition:  I have considered need for hospitalization, however, considering all of the above, I believe this patient is stable for discharge at this time.  Patient/family educated about specific return precautions for given chief complaint and symptoms.  Patient/family educated about follow-up with PCP.     Patient/family expressed understanding of return precautions and need for follow-up. Patient spoken to regarding all imaging and laboratory results and appropriate follow up for these results. All education provided in verbal form with additional information in written form. Time was allowed for answering of patient questions. Patient discharged.    Emergency Department Medication Summary:   Medications  acetaminophen (TYLENOL) tablet 1,000 mg (has no administration in time range)   Clinical Impression:  1. Right arm weakness      Discharge   Final Clinical Impression(s) / ED Diagnoses Final diagnoses:  Right arm weakness    Rx / DC Orders ED Discharge Orders          Ordered    celecoxib (CELEBREX) 200 MG capsule  2 times daily        01/17/24 2106              Glyn Ade, MD 01/17/24 2107
